# Patient Record
Sex: Female | Born: 1970 | Race: Black or African American | Hispanic: No | Marital: Single | Smoking: Former smoker
Health system: Southern US, Community
[De-identification: ages and names within clinical notes are randomized; demographics above are authoritative.]

## PROBLEM LIST (undated history)

## (undated) DIAGNOSIS — I10 Essential (primary) hypertension: Secondary | ICD-10-CM

## (undated) HISTORY — PX: ECTOPIC PREGNANCY SURGERY: SHX613

---

## 2012-08-25 ENCOUNTER — Emergency Department (HOSPITAL_COMMUNITY)
Admission: EM | Admit: 2012-08-25 | Discharge: 2012-08-25 | Disposition: A | Discharge status: Home / Self Care | Payer: Managed Care, Other (non HMO) | Attending: Emergency Medicine | Admitting: Emergency Medicine

## 2012-08-25 ENCOUNTER — Emergency Department (HOSPITAL_COMMUNITY): Payer: Managed Care, Other (non HMO)

## 2012-08-25 ENCOUNTER — Encounter (HOSPITAL_COMMUNITY): Payer: Self-pay | Admitting: Emergency Medicine

## 2012-08-25 DIAGNOSIS — J329 Chronic sinusitis, unspecified: Secondary | ICD-10-CM

## 2012-08-25 DIAGNOSIS — R059 Cough, unspecified: Secondary | ICD-10-CM

## 2012-08-25 DIAGNOSIS — Z87891 Personal history of nicotine dependence: Secondary | ICD-10-CM | POA: Insufficient documentation

## 2012-08-25 DIAGNOSIS — Z79899 Other long term (current) drug therapy: Secondary | ICD-10-CM | POA: Insufficient documentation

## 2012-08-25 DIAGNOSIS — I10 Essential (primary) hypertension: Secondary | ICD-10-CM

## 2012-08-25 DIAGNOSIS — R0982 Postnasal drip: Secondary | ICD-10-CM | POA: Insufficient documentation

## 2012-08-25 DIAGNOSIS — R05 Cough: Secondary | ICD-10-CM

## 2012-08-25 DIAGNOSIS — J019 Acute sinusitis, unspecified: Secondary | ICD-10-CM | POA: Insufficient documentation

## 2012-08-25 HISTORY — DX: Essential (primary) hypertension: I10

## 2012-08-25 MED ORDER — FEXOFENADINE-PSEUDOEPHED ER 60-120 MG PO TB12: 1.0000 | ORAL_TABLET | Freq: Two times a day (BID) | ORAL | Status: AC

## 2012-08-25 MED ORDER — ALBUTEROL SULFATE HFA 108 (90 BASE) MCG/ACT IN AERS
2.0000 | INHALATION_SPRAY | Freq: Once | RESPIRATORY_TRACT | Status: AC
  Administered 2012-08-25: 2 via RESPIRATORY_TRACT
  Filled 2012-08-25: qty 6.7

## 2012-08-25 MED ORDER — AZITHROMYCIN 250 MG PO TABS: 250.0000 mg | ORAL_TABLET | Freq: Every day | ORAL | Status: DC

## 2012-08-25 NOTE — ED Notes (Signed)
Pt went to urgent care, told had nasal drip, was told no congestion in chest, but states she has cough at night, with phlegm, was given nasal spray

## 2012-08-25 NOTE — ED Provider Notes (Signed)
CSN: [id number]     Arrival date & time 08/25/12  1439 History     First MD Initiated Contact with Patient 08/25/12 1629     Chief Complaint  Patient presents with  . Nasal Congestion  . Cough   (Consider location/radiation/quality/duration/timing/severity/associated sxs/prior Treatment) HPI Morgan Maxwell is a 42 y.o. female who presents to ED with complaint of sinus pressure, sinus pain, cough. States symptoms began a week ago. States went to the UC 2 days ago, was told she has post nasal drainage. She was given an atrovent nasal spray which she states she is using, but it is not helping. Pt states she is back today because cough is worsening. Denies chest pain or shortness of breath. Denies fever, chills. Taking mucinex with no improvement. Also states blood pressure elevated, hx of the same, takes lisinopril. Pt states nothing symptoms making it better or worse. No other complaints.   Past Medical History  Diagnosis Date  . Hypertension    Past Surgical History  Procedure Laterality Date  . Ectopic pregnancy surgery     History reviewed. No pertinent family history. History  Substance Use Topics  . Smoking status: Former Games developer  . Smokeless tobacco: Not on file  . Alcohol Use: No   OB History   Grav Para Term Preterm Abortions TAB SAB Ect Mult Living                 Review of Systems  Constitutional: Negative for fever and chills.  HENT: Positive for congestion, rhinorrhea, postnasal drip and sinus pressure. Negative for sore throat, facial swelling, neck pain and neck stiffness.   Respiratory: Positive for cough. Negative for chest tightness, shortness of breath and wheezing.   Cardiovascular: Negative.   Gastrointestinal: Negative.   Genitourinary: Negative for dysuria and flank pain.  Musculoskeletal: Negative for myalgias.  Skin: Negative for rash.  Neurological: Negative for dizziness, weakness and headaches.    Allergies  Review of patient's allergies  indicates no known allergies.  Home Medications   Current Outpatient Rx  Name  Route  Sig  Dispense  Refill  . ipratropium (ATROVENT) 0.06 % nasal spray   Nasal   Place 2 sprays into the nose 2 (two) times daily.         Marland Kitchen lisinopril (PRINIVIL,ZESTRIL) 10 MG tablet   Oral   Take 10 mg by mouth daily.          BP 169/103  Pulse 91  Temp(Src) 97.8 F (36.6 C) (Oral)  Resp 20  SpO2 100% Physical Exam  Nursing note and vitals reviewed. Constitutional: She is oriented to person, place, and time. She appears well-developed and well-nourished. No distress.  HENT:  Head: Normocephalic and atraumatic.  Right Ear: Tympanic membrane, external ear and ear canal normal.  Left Ear: Tympanic membrane, external ear and ear canal normal.  Nose: Mucosal edema and rhinorrhea present. Right sinus exhibits maxillary sinus tenderness. Right sinus exhibits no frontal sinus tenderness. Left sinus exhibits maxillary sinus tenderness. Left sinus exhibits no frontal sinus tenderness.  Mouth/Throat: Uvula is midline, oropharynx is clear and moist and mucous membranes are normal.  Post nasal drainage  Eyes: Conjunctivae are normal.  Neck: Neck supple.  Cardiovascular: Normal rate, regular rhythm and normal heart sounds.   Pulmonary/Chest: Effort normal and breath sounds normal. No respiratory distress. She has no wheezes. She has no rales.  Musculoskeletal: She exhibits no edema.  Neurological: She is alert and oriented to person, place, and time.  Skin:  Skin is warm and dry.    ED Course   Procedures (including critical care time)  Dg Chest 2 View  08/25/2012   *RADIOLOGY REPORT*  Clinical Data: Cough  CHEST - 2 VIEW  Comparison: None.  Findings: The heart and pulmonary vascularity are within normal limits.  The lungs are clear.  No bony abnormality is seen.  IMPRESSION: No acute abnormality noted.   Original Report Authenticated By: Alcide Clever, M.D.     1. Sinusitis   2. Cough   3.  Hypertension     MDM  Pt with nasal congestion, sinus pressure, tenderness for over a week. Pt also now with a cough. CXR negative. VS normal other than hypertensive. Pt asymptomatic for her HTN. Her respiratory rate and Oxygen sat normal. PERC negative. CXR negative. Pt does have seasonal allergies, for which she isn ot taking any medications for. She is afebrile. Will start on allegra d, continue atrovent nasal spray. Z-pack for possible bacterial infection given prolonged symptoms. Will d/c home with close follow up.   Filed Vitals:   08/25/12 1458 08/25/12 1751  BP: 169/103 172/97  Pulse: 91 75  Temp: 97.8 F (36.6 C) 98.3 F (36.8 C)  TempSrc: Oral Oral  Resp: 20   SpO2: 100% 100%     Myriam Jacobson Alaine Loughney, PA-C 08/26/12 0014

## 2012-08-26 NOTE — ED Provider Notes (Signed)
Medical screening examination/treatment/procedure(s) were performed by non-physician practitioner and as supervising physician I was immediately available for consultation/collaboration.    Gibson Lad R Guinevere Stephenson, MD 08/26/12 0018 

## 2012-09-09 ENCOUNTER — Emergency Department (HOSPITAL_COMMUNITY)
Admission: EM | Admit: 2012-09-09 | Discharge: 2012-09-09 | Disposition: A | Discharge status: Home / Self Care | Payer: Managed Care, Other (non HMO) | Attending: Emergency Medicine | Admitting: Emergency Medicine

## 2012-09-09 ENCOUNTER — Encounter (HOSPITAL_COMMUNITY): Payer: Self-pay | Admitting: Emergency Medicine

## 2012-09-09 DIAGNOSIS — Z79899 Other long term (current) drug therapy: Secondary | ICD-10-CM | POA: Insufficient documentation

## 2012-09-09 DIAGNOSIS — I10 Essential (primary) hypertension: Secondary | ICD-10-CM

## 2012-09-09 DIAGNOSIS — Z87891 Personal history of nicotine dependence: Secondary | ICD-10-CM | POA: Insufficient documentation

## 2012-09-09 LAB — BASIC METABOLIC PANEL
GFR calc Af Amer: >90 mL/min (ref >90)
GFR calc non Af Amer: >90 mL/min (ref >90)
Glucose, Bld: 102 mg/dL — ABNORMAL HIGH (ref 70–99)
Potassium: 5.2 mEq/L — ABNORMAL HIGH (ref 3.5–5.1)
Sodium: 135 mEq/L (ref 135–145)

## 2012-09-09 MED ORDER — HYDROCHLOROTHIAZIDE 25 MG PO TABS: 12.5000 mg | ORAL_TABLET | Freq: Every day | ORAL | Status: DC

## 2012-09-09 NOTE — ED Provider Notes (Signed)
CSN: [id number]     Arrival date & time 09/09/12  1849 History     First MD Initiated Contact with Patient 09/09/12 1858     Chief Complaint  Patient presents with  . Hypertension   (Consider location/radiation/quality/duration/timing/severity/associated sxs/prior Treatment) HPI  This is a 42 year old female with a history of hypertension who presents with concerns of worsening her blood pressure. Patient states that she was seen recently when she had a cold. At that time she was taking Sudafed and it was felt that her blood-pressure may be elevated secondary to Sudafed. The patient states that today she started "feeling funny." She checked her blood pressure at [facility] and it was 177 systolic. The patient denies any headache, chest pain, shortness of breath, abdominal pain, urinary symptoms, focal weakness or numbness. She denies any dizziness or vision changes. Patient states that she just "felt like her blood pressure was high." She was instructed to return if her blood pressure did not improve after she stopped taking Sudafed. Past Medical History  Diagnosis Date  . Hypertension    Past Surgical History  Procedure Laterality Date  . Ectopic pregnancy surgery     No family history on file. History  Substance Use Topics  . Smoking status: Former Games developer  . Smokeless tobacco: Not on file  . Alcohol Use: No   OB History   Grav Para Term Preterm Abortions TAB SAB Ect Mult Living                 Review of Systems  Respiratory: Negative for chest tightness and shortness of breath.   Cardiovascular: Negative for chest pain.  Gastrointestinal: Negative for abdominal pain.  Neurological: Negative for dizziness and headaches.    Allergies  Review of patient's allergies indicates no known allergies.  Home Medications   Current Outpatient Rx  Name  Route  Sig  Dispense  Refill  . fexofenadine-pseudoephedrine (ALLEGRA-D) 60-120 MG per tablet   Oral   Take 1 tablet by mouth every 12  (twelve) hours.   30 tablet   0   . ipratropium (ATROVENT) 0.06 % nasal spray   Nasal   Place 2 sprays into the nose 2 (two) times daily.         Marland Kitchen lisinopril (PRINIVIL,ZESTRIL) 10 MG tablet   Oral   Take 10 mg by mouth daily.         . hydrochlorothiazide (HYDRODIURIL) 25 MG tablet   Oral   Take 0.5 tablets (12.5 mg total) by mouth daily.   30 tablet   0    BP 160/95  Pulse 88  Temp(Src) 98.1 F (36.7 C) (Oral)  Resp 18  Ht 5' 7.5" (1.715 m)  Wt 175 lb (79.379 kg)  BMI 26.99 kg/m2  SpO2 100%  LMP 08/20/2012 Physical Exam  Nursing note and vitals reviewed. Constitutional: She is oriented to person, place, and time. She appears well-developed and well-nourished.  HENT:  Head: Normocephalic and atraumatic.  Cardiovascular: Normal rate, regular rhythm and normal heart sounds.   Pulmonary/Chest: Effort normal. No respiratory distress. She has no wheezes.  Abdominal: Soft. Bowel sounds are normal.  Neurological: She is alert and oriented to person, place, and time.  Skin: Skin is warm and dry.  Psychiatric: She has a normal mood and affect.    ED Course   Date: 09/09/2012  Rate: 86  Rhythm: normal sinus rhythm  QRS Axis: normal  Intervals: normal and PR prolonged  ST/T Wave abnormalities: nonspecific T wave changes  Conduction Disutrbances:none  Narrative Interpretation: Normal sinus rhythm with T-wave inversions in the inferior and anterior leads that are unchanged from prior  Old EKG Reviewed: unchanged  Procedures (including critical care time)  Labs Reviewed  BASIC METABOLIC PANEL - Abnormal; Notable for the following:    Potassium 5.2 (*)    Glucose, Bld 102 (*)    All other components within normal limits   No results found. 1. Hypertension     MDM  This is a 42 year old female who presents with concerns for hypertension. She is nontoxic-appearing on exam and vital signs are notable for blood pressure 160/95. Patient has no evidence of  hypertensive urgency or emergency. She denies any headache, chest pain, shortness of breath, urinary symptoms. I obtained a BMP to evaluate the patient's electrolytes before starting her on hydrochlorothiazide. Potassium is 5.2 with hemolysis. EKG shows no evidence of hyperkalemia. Patient will be started on 12.5 mg daily of HCTZ. She was given the patient resource guide and instructed to followup with primary care physician for further management of her hypertension.   Shon Baton, MD 09/09/12 [phone]

## 2012-09-09 NOTE — ED Notes (Signed)
Spoke with Horton, MD:  She instructed pt to continue taking Lisinopril 10mg  in addition to Hydrodiuril which was prescribed here in the ED.

## 2012-09-09 NOTE — ED Notes (Signed)
Pt c/o hypertension x2 days.  Reports hx of HTN and was rx'd lisinopril. Pt reports reports taking sinus medication x2 weeks and was told by PCP that the medication could elevate BP. PCP told pt to get checked out if BP remained high after pt finished taking sinus medication. Pt reports she stopped taking sinus medication x4 days.  Baseline BP is 140-systolic.

## 2013-02-10 ENCOUNTER — Emergency Department (INDEPENDENT_AMBULATORY_CARE_PROVIDER_SITE_OTHER)
Admission: EM | Admit: 2013-02-10 | Discharge: 2013-02-10 | Disposition: A | Discharge status: Home / Self Care | Payer: Managed Care, Other (non HMO) | Source: Home / Self Care | Attending: Emergency Medicine | Admitting: Emergency Medicine

## 2013-02-10 ENCOUNTER — Encounter (HOSPITAL_COMMUNITY): Payer: Self-pay | Admitting: Emergency Medicine

## 2013-02-10 DIAGNOSIS — I10 Essential (primary) hypertension: Secondary | ICD-10-CM

## 2013-02-10 MED ORDER — LISINOPRIL 10 MG PO TABS: 10.0000 mg | ORAL_TABLET | Freq: Every day | ORAL | Status: AC

## 2013-02-10 MED ORDER — HYDROCHLOROTHIAZIDE 25 MG PO TABS: ORAL_TABLET | ORAL | Status: AC

## 2013-02-10 NOTE — ED Notes (Signed)
Needs BP medication refills

## 2013-02-10 NOTE — ED Provider Notes (Signed)
Medical screening examination/treatment/procedure(s) were performed by non-physician practitioner and as supervising physician I was immediately available for consultation/collaboration.  Leslee Homeavid Lilybeth Vien, M.D.  Reuben Likesavid C Yenty Bloch, MD 02/10/13 2152

## 2013-02-10 NOTE — Discharge Instructions (Signed)
Take your medicine every day. Follow up with your doctor as scheduled in February for more refills.    Hypertension Hypertension is another name for high blood pressure. High blood pressure may mean that your heart needs to work harder to pump blood. Blood pressure consists of two numbers, which includes a higher number over a lower number (example: 110/72). HOME CARE   Make lifestyle changes as told by your doctor. This may include weight loss and exercise.  Take your blood pressure medicine every day.  Limit how much salt you use.  Stop smoking if you smoke.  Do not use drugs.  Talk to your doctor if you are using decongestants or birth control pills. These medicines might make blood pressure higher.  Females should not drink more than 1 alcoholic drink per day. Males should not drink more than 2 alcoholic drinks per day.  See your doctor as told. GET HELP RIGHT AWAY IF:   You have a blood pressure reading with a top number of 180 or higher.  You get a very bad headache.  You get blurred or changing vision.  You feel confused.  You feel weak, numb, or faint.  You get chest or belly (abdominal) pain.  You throw up (vomit).  You cannot breathe very well. MAKE SURE YOU:   Understand these instructions.  Will watch your condition.  Will get help right away if you are not doing well or get worse. Document Released: 06/29/2007 Document Revised: 04/04/2011 Document Reviewed: 06/29/2007 [facility] Patient Information 2014 [place], [place].

## 2013-02-10 NOTE — ED Provider Notes (Signed)
CSN: [id number]     Arrival date & time 02/10/13  1054 History   First MD Initiated Contact with Patient 02/10/13 1340     Chief Complaint  Patient presents with  . Medication Refill   (Consider location/radiation/quality/duration/timing/severity/associated sxs/prior Treatment) HPI Comments: Pt out of meds as of today. Has not missed any doses. Checks bp at home, usual systolic is in 120's-130's. Has appt with new pcp 03/18/13, cannot be seen sooner. REquests refills of meds to last until this appt.   Patient is a 43 y.o. female presenting with hypertension. The history is provided by the patient.  Hypertension This is a chronic problem. The problem occurs constantly. The problem has not changed since onset.Pertinent negatives include no chest pain, no abdominal pain, no headaches and no shortness of breath. Nothing aggravates the symptoms. Nothing relieves the symptoms. Treatments tried: own meds. The treatment provided significant relief.    Past Medical History  Diagnosis Date  . Hypertension    Past Surgical History  Procedure Laterality Date  . Ectopic pregnancy surgery     History reviewed. No pertinent family history. History  Substance Use Topics  . Smoking status: Former Games developermoker  . Smokeless tobacco: Not on file  . Alcohol Use: No   OB History   Grav Para Term Preterm Abortions TAB SAB Ect Mult Living                 Review of Systems  Respiratory: Negative for shortness of breath.   Cardiovascular: Negative for chest pain, palpitations and leg swelling.  Gastrointestinal: Negative for abdominal pain.  Neurological: Negative for headaches.    Allergies  Review of patient's allergies indicates no known allergies.  Home Medications   Current Outpatient Rx  Name  Route  Sig  Dispense  Refill  . fexofenadine-pseudoephedrine (ALLEGRA-D) 60-120 MG per tablet   Oral   Take 1 tablet by mouth every 12 (twelve) hours.   30 tablet   0   . hydrochlorothiazide  (HYDRODIURIL) 25 MG tablet      Take 1/2 tablet daily   30 tablet   0   . ipratropium (ATROVENT) 0.06 % nasal spray   Nasal   Place 2 sprays into the nose 2 (two) times daily.         Marland Kitchen. lisinopril (PRINIVIL,ZESTRIL) 10 MG tablet   Oral   Take 1 tablet (10 mg total) by mouth daily.   90 tablet   0    BP 156/90  Pulse 80  Temp(Src) 98.5 F (36.9 C) (Oral)  Resp 16  SpO2 100%  LMP 01/25/2013 Physical Exam  Constitutional: She appears well-developed and well-nourished. No distress.  Neck: Carotid bruit is not present.  Cardiovascular: Normal rate and regular rhythm.   No peripheral edema  Pulmonary/Chest: Effort normal and breath sounds normal.    ED Course  Procedures (including critical care time) Labs Review Labs Reviewed - No data to display Imaging Review No results found.  EKG Interpretation    Date/Time:    Ventricular Rate:    PR Interval:    QRS Duration:   QT Interval:    QTC Calculation:   R Axis:     Text Interpretation:              MDM   1. HTN (hypertension)   rx hctz 25mg  1/2 tab po daily #30. Rx lisinpril 10mg  daily #90. Pt to see pcp 03/18/13 for further mgmt of htn.      Marylene LandAngela  Farrel Demark, NP 02/10/13 1408

## 2014-05-15 IMAGING — CR DG CHEST 2V
2 series · 2 of 2 positions shown · non-contrast
Comparison: None.

CLINICAL DATA: Cough

CHEST - 2 VIEW

[w chest pa]
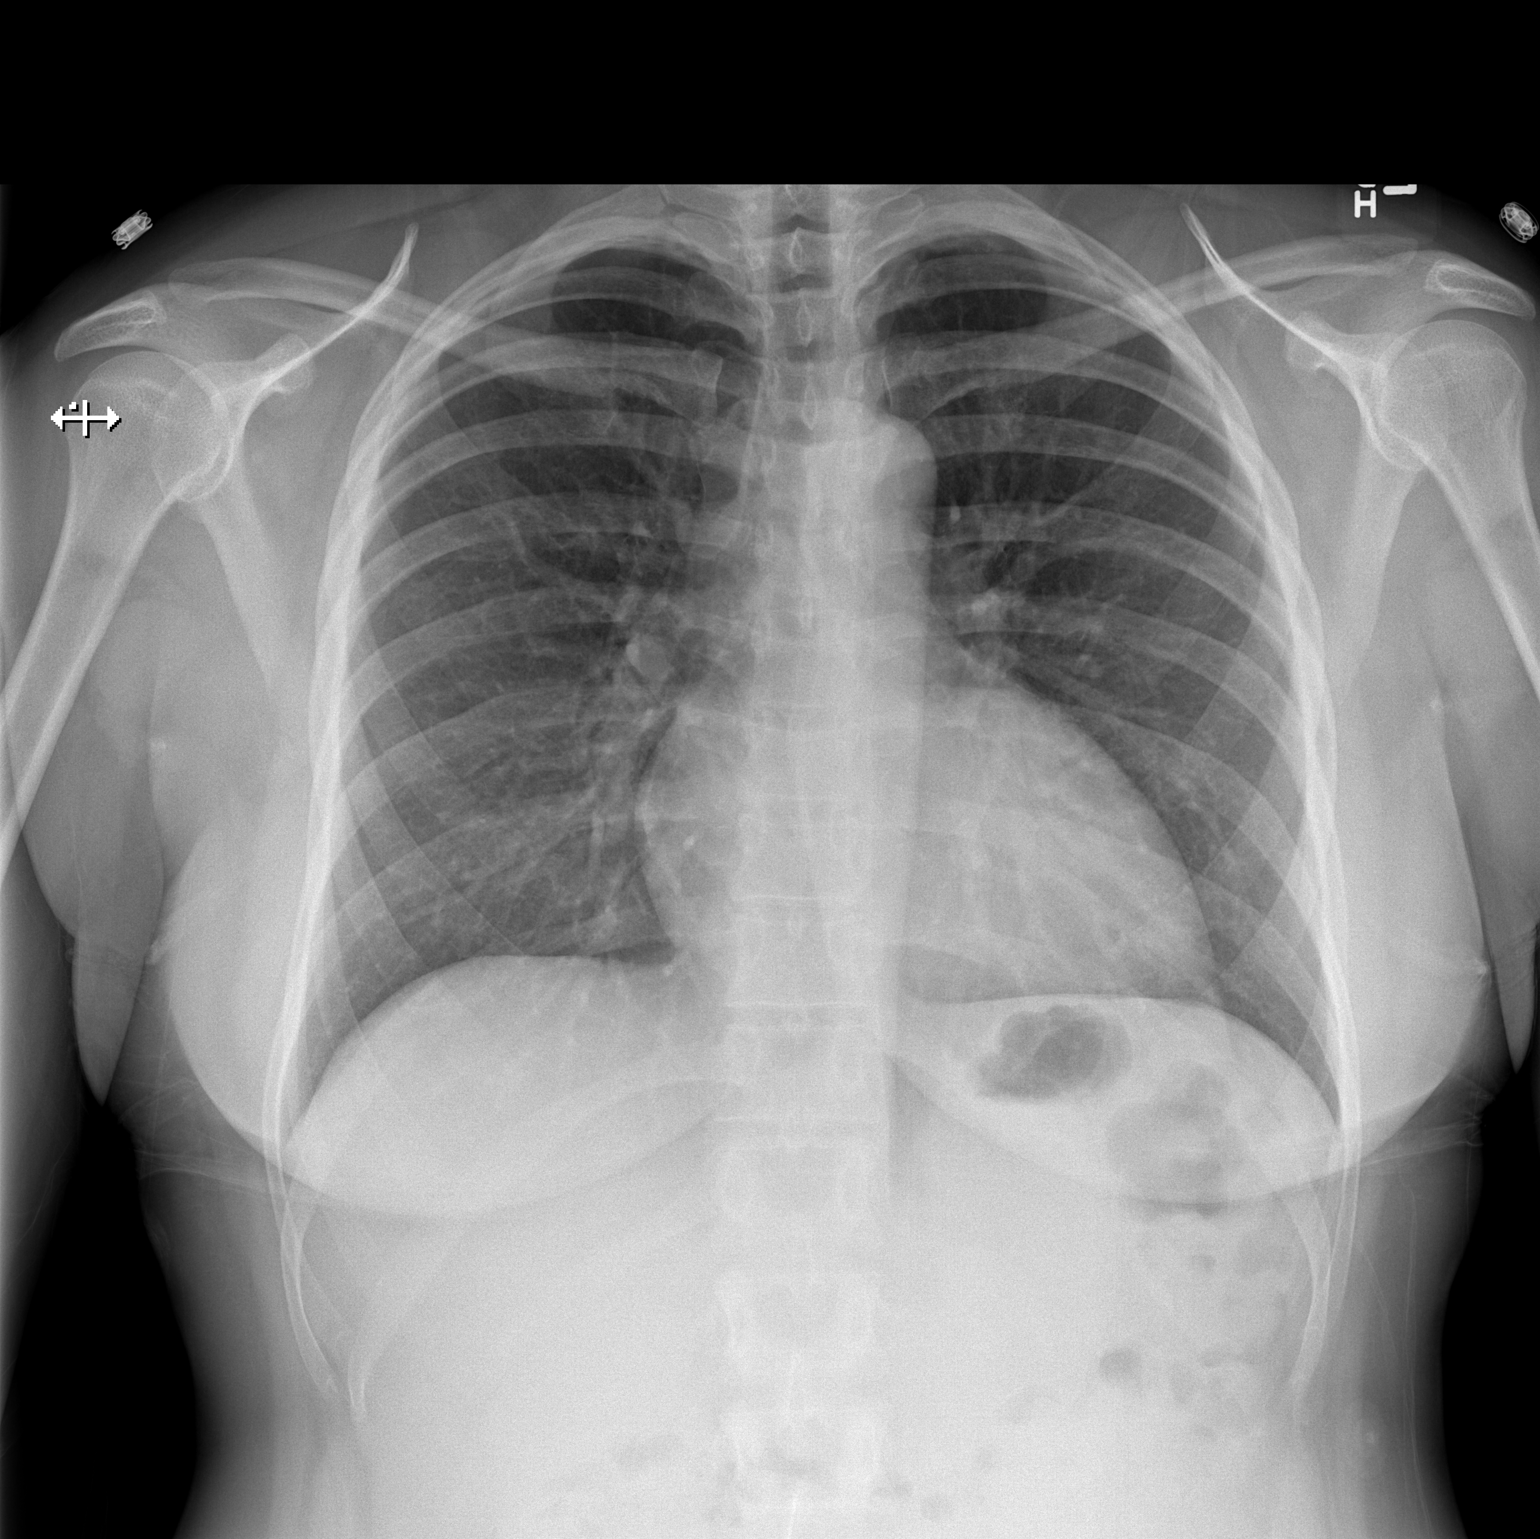

[w chest lat]
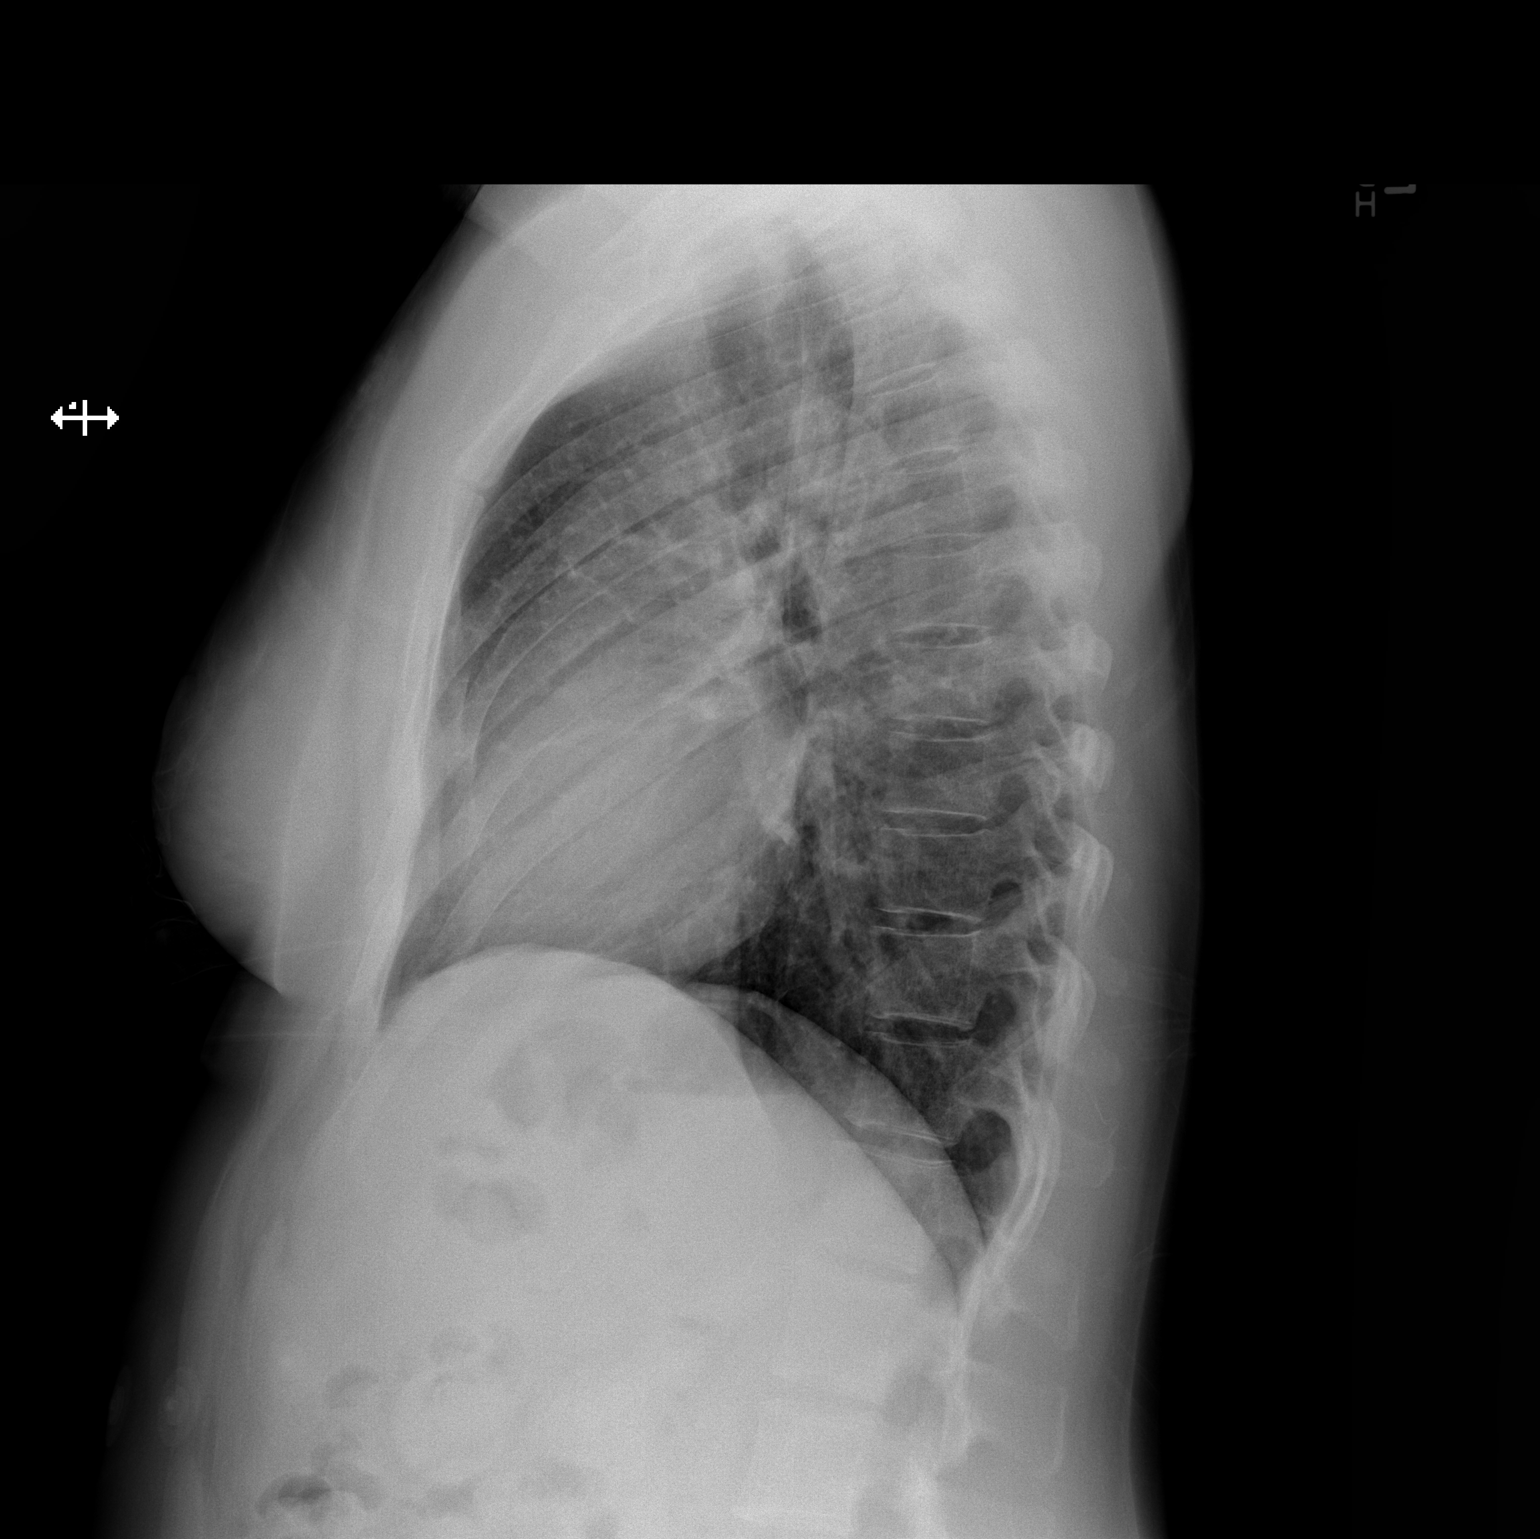

[2 of 2 positions shown; findings below may reference images not displayed]

FINDINGS: The heart and pulmonary vascularity are within normal
limits.  The lungs are clear.  No bony abnormality is seen.
IMPRESSION: No acute abnormality noted.

## 2021-06-15 ENCOUNTER — Encounter (HOSPITAL_COMMUNITY): Payer: Self-pay | Admitting: Pharmacy Technician

## 2021-06-15 ENCOUNTER — Emergency Department (HOSPITAL_COMMUNITY)
Admission: EM | Admit: 2021-06-15 | Discharge: 2021-06-15 | Disposition: A | Discharge status: Home / Self Care | Payer: BC Managed Care – PPO | Attending: Emergency Medicine | Admitting: Emergency Medicine

## 2021-06-15 ENCOUNTER — Other Ambulatory Visit: Payer: Self-pay

## 2021-06-15 DIAGNOSIS — M79602 Pain in left arm: Secondary | ICD-10-CM | POA: Insufficient documentation

## 2021-06-15 NOTE — Discharge Instructions (Signed)
Please keep your appointment with your doctor in the next week.  If you are still having symptoms after a week without any significant improvement you may need to see a orthopedist..   If you develop chest pain, shortness of breath, numbness, weakness, or have any new or concerning symptoms please immediately seek additional medical care and evaluation.  Please take Ibuprofen (Advil, motrin) and Tylenol (acetaminophen) to relieve your pain.    You may take up to 600 MG (3 pills) of normal strength ibuprofen every 8 hours as needed.   You make take tylenol, up to 1,000 mg (two extra strength pills) every 8 hours as needed.   It is safe to take ibuprofen and tylenol at the same time as they work differently.   Do not take more than 3,000 mg tylenol in a 24 hour period (not more than one dose every 8 hours.  Please check all medication labels as many medications such as pain and cold medications may contain tylenol.  Do not drink alcohol while taking these medications.  Do not take other NSAID'S while taking ibuprofen (such as aleve or naproxen).  Please take ibuprofen with food to decrease stomach upset.

## 2021-06-15 NOTE — ED Triage Notes (Signed)
Pt here with L upper arm pain radiating into her shoulder after picking up a heavy box yesterday. Pt states initially she did not feel the pain but by last night she was sore.

## 2021-06-15 NOTE — ED Provider Notes (Signed)
[facility] DEPT Provider Note   CSN: [id number] Arrival date & time: 06/15/21  1134     History  Chief Complaint  Patient presents with   Arm Pain    Morgan Maxwell is a 51 y.o. female who presents today for evaluation of pain in the left arm. She states that this started yesterday after she was lifting up a very heavy box that she states she misjudged.  She reports that she thought the box was a lot lighter and so she went to pick it up quickly and realized it was much heavier. The next time she attempted to lift a box she had pain in her left posterior arm.  She states that every time she has attempted to lift something she has been having pain in the posterior upper left arm.  She denies any chest pain, cough, shortness of breath.  No nausea or vomiting.  No weakness numbness or paresthesias.  She took a BC powder without relief.    HPI     Home Medications Prior to Admission medications   Medication Sig Start Date End Date Taking? Authorizing Provider  fexofenadine-pseudoephedrine (ALLEGRA-D) 60-120 MG per tablet Take 1 tablet by mouth every 12 (twelve) hours. 08/25/12   Kirichenko, Lahoma Rocker, PA-C  hydrochlorothiazide (HYDRODIURIL) 25 MG tablet Take 1/2 tablet daily 02/10/13   Carvel Getting, NP  ipratropium (ATROVENT) 0.06 % nasal spray Place 2 sprays into the nose 2 (two) times daily.    [provider]  lisinopril (PRINIVIL,ZESTRIL) 10 MG tablet Take 1 tablet (10 mg total) by mouth daily. 02/10/13   Carvel Getting, NP      Allergies    Patient has no known allergies.    Review of Systems   Review of Systems See above Physical Exam Updated Vital Signs BP (!) 156/99   Pulse 76   Temp 97.9 F (36.6 C)   Resp 16   SpO2 97%  Physical Exam Vitals and nursing note reviewed.  Constitutional:      General: She is not in acute distress. HENT:     Head: Normocephalic and atraumatic.  Cardiovascular:     Rate and Rhythm: Normal  rate and regular rhythm.     Pulses: Normal pulses.     Heart sounds: Normal heart sounds.     Comments: 2+ right radial pulse. Pulmonary:     Effort: Pulmonary effort is normal. No respiratory distress.     Breath sounds: Normal breath sounds.  Chest:     Chest wall: No tenderness.  Musculoskeletal:     Cervical back: No rigidity.     Comments: There is tenderness to palpation over the left posterior arm superior to the elbow and the lateral left shoulder. There is pain with range of motion of the left shoulder and elbow both active and passively however is worse actively.  Her compartments in upper and lower left arm are soft and easily compressible.   Skin:    General: Skin is warm and dry.  Neurological:     Mental Status: She is alert. Mental status is at baseline.     Sensory: No sensory deficit.     Comments: Awake and alert, answers all questions appropriately.  Speech is not slurred.    Psychiatric:        Mood and Affect: Mood normal.        Behavior: Behavior normal.    ED Results / Procedures / Treatments   Labs (all labs ordered  are listed, but only abnormal results are displayed) Labs Reviewed - No data to display  EKG None  Radiology No results found.  Procedures Procedures    Medications Ordered in ED Medications - No data to display  ED Course/ Medical Decision Making/ A&P                           Medical Decision Making Patient is a 51 year old woman who presents today for evaluation of left arm pain that started yesterday after she lifted a heavy box. On my exam her pain is consistent with musculoskeletal pain.  It is minimal at rest however worsens significantly with movement and she notes the time that is the worst is whenever she attempts to lift or pick anything up in a similar fashion to how she injured it. She does not have any chest pain or shortness of breath.  She is neurovascularly intact on my exam.  I doubt ACS or referred cardiac  pain. We discussed role of imaging, I suspect that this would be low yield for a x-ray today, and through shared decision making this was not ordered. She has a follow-up with doctors appointment already scheduled for 1 week. I recommended a recheck on her blood pressure, she reports compliance with her antihypertensives.  Recommended if she does not have any improvement with OTC NSAIDs, conservative care in 1 week consideration for orthopedics referral. She is given a work note for light duty.  Risk OTC drugs. Decision regarding hospitalization.   Return precautions were discussed with patient who states their understanding.  At the time of discharge patient denied any unaddressed complaints or concerns.  Patient is agreeable for discharge home.  Note: Portions of this report may have been transcribed using voice recognition software. Every effort was made to ensure accuracy; however, inadvertent computerized transcription errors may be present        Final Clinical Impression(s) / ED Diagnoses Final diagnoses:  Left arm pain    Rx / DC Orders ED Discharge Orders     None         Lorin Glass, PA-C 06/15/21 [place], DO 06/15/21 1503
